# Patient Record
Sex: Male | Born: 1970 | Race: White | Hispanic: No | Smoking: Never smoker
Health system: Southern US, Community
[De-identification: ages and names within clinical notes are randomized; demographics above are authoritative.]

## PROBLEM LIST (undated history)

## (undated) DIAGNOSIS — I4892 Unspecified atrial flutter: Secondary | ICD-10-CM

## (undated) DIAGNOSIS — M1990 Unspecified osteoarthritis, unspecified site: Secondary | ICD-10-CM

## (undated) DIAGNOSIS — E1140 Type 2 diabetes mellitus with diabetic neuropathy, unspecified: Secondary | ICD-10-CM

## (undated) DIAGNOSIS — M109 Gout, unspecified: Secondary | ICD-10-CM

## (undated) DIAGNOSIS — I10 Essential (primary) hypertension: Secondary | ICD-10-CM

## (undated) DIAGNOSIS — I4891 Unspecified atrial fibrillation: Secondary | ICD-10-CM

## (undated) DIAGNOSIS — I200 Unstable angina: Secondary | ICD-10-CM

## (undated) DIAGNOSIS — E119 Type 2 diabetes mellitus without complications: Secondary | ICD-10-CM

## (undated) HISTORY — PX: APPENDECTOMY: SHX54

## (undated) HISTORY — PX: SHOULDER SURGERY: SHX246

## (undated) HISTORY — PX: TONSILLECTOMY: SUR1361

---

## 2014-07-28 ENCOUNTER — Observation Stay: Payer: Self-pay | Admitting: Internal Medicine

## 2014-07-29 DIAGNOSIS — R079 Chest pain, unspecified: Secondary | ICD-10-CM

## 2014-09-10 ENCOUNTER — Emergency Department
Admit: 2014-09-10 | Disposition: A | Discharge status: Other Acute Inpatient Hospital | Payer: Self-pay | Admitting: Emergency Medicine

## 2014-09-10 LAB — CBC
HCT: 46.6 % (ref 40.0–52.0)
HGB: 15.2 g/dL (ref 13.0–18.0)
MCH: 27.7 pg (ref 26.0–34.0)
MCHC: 32.6 g/dL (ref 32.0–36.0)
MCV: 85 fL (ref 80–100)
Platelet: 181 10*3/uL (ref 150–440)
RBC: 5.5 10*6/uL (ref 4.40–5.90)
RDW: 14.3 % (ref 11.5–14.5)
WBC: 9.3 10*3/uL (ref 3.8–10.6)

## 2014-09-10 LAB — BASIC METABOLIC PANEL
Anion Gap: 7 (ref 7–16)
BUN: 13 mg/dL
CALCIUM: 9 mg/dL
CO2: 30 mmol/L
Chloride: 102 mmol/L
Creatinine: 1.08 mg/dL
EGFR (African American): >60
Glucose: 157 mg/dL — ABNORMAL HIGH
Potassium: 3.6 mmol/L
SODIUM: 139 mmol/L

## 2014-09-10 LAB — PRO B NATRIURETIC PEPTIDE: B-Type Natriuretic Peptide: 35 pg/mL

## 2014-09-10 LAB — TROPONIN I

## 2014-10-09 NOTE — Consult Note (Signed)
Patient is over weight limit for cardiac cath. May go home with f/u office next tuesday 11 am.  Electronic Signatures: Radene KneeKhan, Tannon Peerson Ali (MD)  (Signed on 19-Feb-16 11:24)  Authored  Last Updated: 19-Feb-16 11:24 by Radene KneeKhan, Tyrian Peart Ali (MD)

## 2014-10-09 NOTE — H&P (Signed)
PATIENT NAME:  Francisco Gardner, Francisco Gardner MR#:  [id number] DATE OF BIRTH:  1971/02/08  DATE OF ADMISSION:  07/28/2014  REFERRING EMERGENCY ROOM PHYSICIAN: Dr. Alfonse FlavorsPhilip Stafford.   PRIMARY CARE PHYSICIAN: Nonlocal.   CHIEF COMPLAINT: Chest pain.   HISTORY OF PRESENT ILLNESS: This 44 year old man with past medical history of diabetes mellitus type 2 with peripheral neuropathy, hypertension, obesity, reflux, presents with chest pain starting 2 hours prior to presentation to the Emergency Room. The patient reports that he awoke this morning and felt funny, slightly lightheaded, took his blood pressure and it was 150/90. He continued to feel strange and blood pressure increased up to 182/122 with a pulse of 106. He then developed a sharp left-sided chest pain with no radiation, 7-8 out of 10. He also had some diaphoresis and cold chills, no nausea or vomiting. Pain has now progressed to being left chest and left upper quadrant as well as some left-sided back pain. He has not had any nausea, vomiting, or diarrhea. He does have a history of hiatal hernia and nephrolithiasis. On presentation to the Emergency Room his EKG is normal with no ST changes. His first set of enzymes is negative. He is being admitted for cardiac rule out. The patient reports that he had a stress Myoview 6-7 years ago and was told that it was negative for ischemic change.   PAST MEDICAL HISTORY:  1. Hypertension.  2. Depression.  3. Diabetes mellitus type 2.  4. Peripheral neuropathy due to diabetes.  5. Osteoarthritis.  6. Gout.  7. Fibromyalgia.  8. Nephrolithiasis.  9. History of superficial thrombophlebitis.  10. Gastroesophageal reflux disease.  11. Obesity with BMI of 48.2.   SOCIAL HISTORY: The patient currently lives with his sister. He moved here 4 days ago from [place]. He is now separated from his wife. He has never been a cigarette smoker. He drinks alcohol rarely, last 2-3 drinks [place] [employer]. No illicit  substance abuse. He is currently applying for disability.   FAMILY MEDICAL HISTORY: Positive for diabetes in his mother. Coronary artery disease in his father. No history of stroke. His mother also had rheumatic fever with resultant congestive heart failure.   ALLERGIES: HE IS ALLERGIC TO PENICILLIN WHICH CAUSES ANAPHYLAXIS.   HOME MEDICATIONS:  1. Sertraline 50 mg 1 tablet daily.  2. Lisinopril 40 mg 1 tablet daily.  3. Janumet 1000 mg-50 mg 1 tablet twice a day.  4. Indomethacin 25 mg 1 capsule once a day at bedtime.  5. Gabapentin 100 mg 2 tablets 3 times a day.  6. Dexilant 60 mg 1 capsule once a day in the morning.  7. Carvedilol 12.5 mg 1 tablet twice a day.  8. Caduet 5 mg-10 mg 1 tablet once a day.  9. Amitriptyline 50 mg 1-2 tablets once a day at bedtime.   REVIEW OF SYSTEMS:  CONSTITUTIONAL: No fever fatigue, weakness, or weight change.  HEENT: No change in vision or hearing. No pain in the eyes or ears. He has a very dry mouth, but no sore throat. No difficulty swallowing.  RESPIRATORY: No cough, wheezing, or hemoptysis. No dyspnea. It is slightly painful to take a deep breath. No history of COPD.   CARDIOVASCULAR: Positive for chest pain, no orthopnea, positive for edema after the 5 hour drive from [place]. No palpitations. No syncope.  GASTROINTESTINAL: No nausea, vomiting, diarrhea. Does have left upper quadrant pain at this time.  GENITOURINARY: No dysuria or frequency.  ENDOCRINE: No polyuria, polydipsia, or  hot or cold intolerance.  SKIN: No new rashes or easy bruising.  MUSCULOSKELETAL: No new pain in the neck, shoulders, knees, or hips. Does have a mid thoracic back pain. Does have history of gout.  NEUROLOGIC: No focal numbness or weakness. No confusion, CVA, or seizure. No memory loss.  PSYCHIATRIC: Does have depression and has been under a significant amount of stress recently due to being separated from his wife.   PHYSICAL EXAMINATION:  VITAL SIGNS:  Temperature 98.9, pulse 93, respirations 21, blood pressure 158/106, oxygenation 95% on room air.  GENERAL: No acute distress, obese.  HEENT: Pupils are fairly dilated, they are equal, round, and reactive to light, conjunctivae are clear, oral mucous membranes are dry with caked mucus in the mouth, no posterior oropharyngeal erythema or edema. Good dentition. Trachea is midline. Thyroid nontender.  RESPIRATORY: Lungs clear to auscultation bilaterally with good air movement.  CARDIOVASCULAR: Distant heart sounds. No murmurs, rubs, or gallops. Rhythm is regular. No peripheral edema. Peripheral pulses are 2 +.  ABDOMEN: Soft, nontender. He does have a midline hernia. No guarding, no rebound. Bowel sounds are normal.  SKIN: No rashes or open lesions, no new wounds.  LYMPHADENOPATHY: No cervical lymphadenopathy.  NEUROLOGIC: Cranial nerves II through XII grossly intact. Strength and sensation intact, 4 out of 4 bilaterally upper and lower extremities.  PSYCHIATRIC: The patient alert and oriented with good insight into his clinical condition.   LABORATORY DATA: Sodium 140, potassium 4.0, chloride 106, bicarbonate 31, BUN 14, creatinine 1.33. GFR is greater than 60. Calcium 8.8. Troponin less than 0.02. White blood cells 7.5, hemoglobin 15.6, platelets 213,000, MCV is 86. INR is 1.0.   IMAGING: Chest x-ray, no edema or consolidation.   ASSESSMENT AND PLAN:  1.  Left-sided chest pain: The patient being admitted for ACS rule out. We will cycle cardiac enzymes, monitor on telemetry, and recheck EKG in the morning. He is on aspirin, beta blocker, and a statin. We will hold off on heparin drip unless troponins escalate or EKG changes arise. Check ECHO. There is also a possibility of PE in this obese patient with history of superficial thrombophlebitis who has just had a 5 hour car trip, which ended with a lower extremity edema. He is too large for our CT scanner. We will check bilateral lower extremity Dopplers  and a VQ scan. He will be on heparin for DVT prophylaxis. We will hold off on starting therapeutic heparin at this time. He will have nitroglycerin p.r.n. for chest pain.  2.  Diabetes mellitus, type II: Check a hemoglobin A1c. Hold oral hypoglycemic agents and start sliding scale insulin.  3.  Peripheral neuropathy: Continue with Neurontin.  4.  Hypertension: Continue with Coreg and Caduet.  We will decrease his lisinopril to 20 mg as this should not impact blood pressure, but may improve his creatinine.  5.  Obesity: Calorie restricted diet.  6.  Gastroesophageal reflux disease: PPI.   7.  Depression: Continue home medications.  8.  Prophylaxis: As mentioned above heparin for DVT prophylaxis, PPI for gastroesophageal reflux disease.   TIME SPENT ON ADMISSION: 40 minutes.     ____________________________ Ena Dawley. Clent Ridges, MD cpw:bu D: 07/28/2014 14:35:12 ET T: 07/28/2014 14:57:28 ET JOB#: [id number]  cc: Santina Evans P. Clent Ridges, MD, <Dictator> Gale Journey MD ELECTRONICALLY SIGNED 07/28/2014 23:24

## 2014-10-09 NOTE — Consult Note (Signed)
PATIENT NAME:  Francisco Gardner, Francisco Gardner MR#:  [id number] DATE OF BIRTH:  07-08-1970  DATE OF CONSULTATION:  07/29/2014  REFERRING PHYSICIAN:   CONSULTING PHYSICIAN:  Laurier NancyShaukat A. Takeo Harts, MD  INDICATION FOR CONSULTATION: Chest pain.   HISTORY OF PRESENT ILLNESS: This is a 44 year old white male with a past medical history of diabetes mellitus, hypertension, hyperlipidemia, morbid obesity, peripheral vascular disease with peripheral neuropathy, who presented to the Emergency Room with chest pain. Chest pain is in left precordium associated with shortness of breath and diaphoresis. He had a stress Myoview in the past, 6 or 7 years ago, and was told it was negative.   PAST MEDICAL HISTORY: Hypertension, depression, diabetes mellitus type 2, peripheral neuropathy, osteoarthritis, gout, fibromyalgia, and nephrolithiasis, GI reflux, BMI over 48.2, history of thrombophlebitis.   SOCIAL HISTORY: He moved from [place]. He drinks about 2 to 3 beers a day. He is separated from his wife. He never smoked cigarettes.   FAMILY HISTORY: Positive for diabetes, coronary artery disease. Father had bypass.   ALLERGIES: PENICILLIN AND CAUSES ANAPHYLAXIS.   PHYSICAL EXAMINATION: GENERAL: He is alert and oriented x3, in mild distress due to shortness of breath and chest pain. VITAL SIGNS: Blood pressure is 160/95, respirations 20, pulse 71. Temperature is 98, saturation is 96%.  NECK: Revealed no JVD.  LUNGS: Clear.  HEART: Regular rate and rhythm. Normal S1, S2. No audible murmur.  ABDOMEN: Soft, nontender, positive bowel sounds.  EXTREMITIES: No pedal edema.  NEUROLOGIC: The patient appears to be intact.  DIAGNOSTIC DATA: EKG shows normal sinus rhythm. No acute changes. Cardiac enzymes are negative. Creatinine is 1.33.   ASSESSMENT AND PLAN: The patient has unstable angina. Multiple risk factors including diabetes, hypertension, hyperlipidemia, morbid obesity. Because of morbid obesity, a nuclear scan would not  be helpful. He had an echocardiogram, which showed segmental wall motion abnormalities suggestive of coronary artery disease with all the multiple risk factors of coronary artery disease. Advise proceeding with cardiac catheterization.    ____________________________ Laurier NancyShaukat A. Coleby Yett, MD sak:sw D: 07/29/2014 08:50:41 ET T: 07/29/2014 08:55:48 ET JOB#: [id number]  cc: Laurier NancyShaukat A. Nyasia Baxley, MD, <Dictator> Laurier NancySHAUKAT A Lillianna Sabel MD ELECTRONICALLY SIGNED 08/11/2014 12:17

## 2014-10-09 NOTE — Discharge Summary (Signed)
PATIENT NAME:  Francisco Gardner, Francisco Gardner MR#:  [id number] DATE OF BIRTH:  [birth year]  DATE OF ADMISSION:  07/28/2014 DATE OF DISCHARGE:  08/02/2014  PRESENTING COMPLAINT: Chest pain.   DISCHARGE DIAGNOSES:  1.  Chest pain.  2.  Hypertension.  3.  Type 2 diabetes.  4.  Morbid obesity.   PROCEDURES: Two-day Myoview stress test that showed normal sinus rhythm. LV global function was normal. Myocardial perfusion showed evidence of pathology that has abnormal appearance. There is no significant wall motion abnormality. Ejection fraction of 54%. No EKG changes concerning for ischemia. Mild ischemia in the LAD territory and borderline LV systolic function. Advised aspirin, Plavix, statins, and Imdur.   CONSULTATIONS: Cardiology consultation with Dr. Adrian BlackwaterShaukat Khan.   DISCHARGE MEDICATIONS: 1.  Amitriptyline 50 mg 1-2 once at bedtime.  2.  Carvedilol 12.5 mg 1 tablet b.i.d.  3.  Gabapentin 100 mg 2 capsules 3 times a day.  4.  Sertraline 50 mg 1 tablet daily.  5.  Dexilant 60 mg p.o. daily.  6.  Janumet 1000/50 one tablet b.i.d.  7.  Indomethacin 25 mg at bedtime.  8.  Caduet 5/10 one tablet daily.  9.  Lisinopril 40 mg daily.  10.  Aspirin 81 mg daily.  11.  Plavix 75 mg daily.    FOLLOWUP: With Dr. Adrian BlackwaterShaukat Khan in 1-2 weeks.  BRIEF HISTORY AND HOSPITAL COURSE: [name] is a very obese 425-pound gentleman with past medical history of hypertension and diabetes, comes in with chest pain. He was admitted with:  1.  Chest pain, likely unstable angina. Lower extremity Doppler negative for clot. VQ scan low probability. Troponin x 3 were negative. Since the patient's echocardiogram showed some wall motion abnormality, hence, could not do a cardiac catheterization due to his weight. The patient had a 2-day Myoview stress test results as above were noted. He will continue medical treatment with aspirin, Plavix, beta blockers, and Imdur. Follow up with Dr. Welton FlakesKhan as outpatient.  2.  Hypertension. Continued  Coreg and lisinopril.  3.  Type 2 diabetes. Resumed back his home medications.  4.  Neuropathy from diabetes. Continue Neurontin.  5.  Hyperlipidemia, on statins.   Hospital stay otherwise remained stable. The patient remained a full code.   TIME SPENT: 40 minutes.    ____________________________ Wylie HailSona A. Allena KatzPatel, MD sap:bm D: 08/04/2014 14:34:28 ET T: 08/05/2014 01:35:34 ET JOB#: [id number]  cc: Alaila Pillard A. Allena KatzPatel, MD, <Dictator> Laurier NancyShaukat A. Khan, MD Willow OraSONA A Lakeith Careaga MD ELECTRONICALLY SIGNED 08/09/2014 17:35

## 2014-10-09 NOTE — Consult Note (Signed)
Stress test is mildly abnormal, but unable to do cath due to over the wight limit, advise medical treatment initially and if it fails, [facility]  Electronic Signatures: Radene KneeKhan, Shaukat Ali (MD)  (Signed on 23-Feb-16 12:37)  Authored  Last Updated: 23-Feb-16 12:37 by Radene KneeKhan, Shaukat Ali (MD)

## 2014-10-18 ENCOUNTER — Ambulatory Visit: Payer: Self-pay

## 2015-07-03 ENCOUNTER — Emergency Department: Payer: Self-pay

## 2015-07-03 ENCOUNTER — Emergency Department
Admission: EM | Admit: 2015-07-03 | Discharge: 2015-07-03 | Disposition: A | Discharge status: Home / Self Care | Payer: Self-pay | Attending: Emergency Medicine | Admitting: Emergency Medicine

## 2015-07-03 DIAGNOSIS — R0789 Other chest pain: Secondary | ICD-10-CM | POA: Insufficient documentation

## 2015-07-03 DIAGNOSIS — Z880 Allergy status to penicillin: Secondary | ICD-10-CM | POA: Insufficient documentation

## 2015-07-03 LAB — CBC WITH DIFFERENTIAL/PLATELET
Basophils Absolute: 0 10*3/uL (ref 0–0.1)
Basophils Relative: 1 %
Eosinophils Absolute: 0.1 10*3/uL (ref 0–0.7)
Eosinophils Relative: 1 %
HEMATOCRIT: 43.4 % (ref 40.0–52.0)
HEMOGLOBIN: 14.5 g/dL (ref 13.0–18.0)
LYMPHS ABS: 0.9 10*3/uL — AB (ref 1.0–3.6)
LYMPHS PCT: 13 %
MCH: 27.5 pg (ref 26.0–34.0)
MCHC: 33.4 g/dL (ref 32.0–36.0)
MCV: 82.4 fL (ref 80.0–100.0)
MONOS PCT: 6 %
Monocytes Absolute: 0.4 10*3/uL (ref 0.2–1.0)
NEUTROS ABS: 5.6 10*3/uL (ref 1.4–6.5)
NEUTROS PCT: 79 %
PLATELETS: 141 10*3/uL — AB (ref 150–440)
RBC: 5.27 MIL/uL (ref 4.40–5.90)
RDW: 14.1 % (ref 11.5–14.5)
WBC: 7 10*3/uL (ref 3.8–10.6)

## 2015-07-03 LAB — TROPONIN I: Troponin I: <0.03 ng/mL (ref <0.031)

## 2015-07-03 LAB — BASIC METABOLIC PANEL
Anion gap: 8 (ref 5–15)
BUN: 12 mg/dL (ref 6–20)
CHLORIDE: 105 mmol/L (ref 101–111)
CO2: 24 mmol/L (ref 22–32)
CREATININE: 1.01 mg/dL (ref 0.61–1.24)
Calcium: 9 mg/dL (ref 8.9–10.3)
GFR calc Af Amer: >60 mL/min (ref >60)
GLUCOSE: 171 mg/dL — AB (ref 65–99)
POTASSIUM: 3.7 mmol/L (ref 3.5–5.1)
Sodium: 137 mmol/L (ref 135–145)

## 2015-07-03 MED ORDER — OXYCODONE-ACETAMINOPHEN 5-325 MG PO TABS
1.0000 | ORAL_TABLET | Freq: Once | ORAL | Status: AC
  Administered 2015-07-03: 1 via ORAL
  Filled 2015-07-03: qty 1

## 2015-07-03 NOTE — Discharge Instructions (Signed)
We are reassured by your findings today. If you have increased pain in her chest, shortness of breath every her baseline, nausea, vomiting, leg swelling, numbness, weakness, change in chronic chest pain, worsening chronic chest pain 40 feel worse in any way return to the emergency department. Follow-up without fail with cardiologist tomorrow.

## 2015-07-03 NOTE — ED Notes (Signed)
Pt reports worsening SOB, tired and light headed. Pt reports sob at baseline but reports it is worse today

## 2015-07-03 NOTE — ED Notes (Addendum)
Pt to triage via w/c with no distress noted; Pt c/o pain to left upper chest radiating into left arm accomp by dizziness and weakness, SOB

## 2015-07-03 NOTE — ED Provider Notes (Addendum)
[facility] Emergency Department Provider Note  ____________________________________________   I have reviewed the triage vital signs and the nursing notes.   HISTORY  Chief Complaint Chest Pain    HPI Francisco Gardner is a 45 y.o. male who has a history of morbid obesity, chronic recurrent chest pain which she states he gets twice a month for at least, dyspnea on exertion presents today with the above complaints. He states he is having all his usual symptoms but they've "might be" a little worse today. He is also concerned because he does not have his glucose strips and has not been able to check his sugar. He denies any fever or chills nausea or vomiting. The patient states this is exact same pain he gets every week or 2. He did take aspirin at home. He states he is compliant with his medications. He states his pain is approximately 2 out of 10. It happened at rest and has been there present for the entire day. He denies any exertional symptoms aside from his baseline shortness of breath from "being overweight". Denies any cough or runny nose pleuritic chest pain. Denies any leg swelling or history of PE or DVT. He did have a negative VQ scan last time he was admitted for the same symptoms. Patient had a negative cardiac catheter on a bariatric bed in April of this year CARDIAC CATHETERIZATION:  Diagnostic Findings Coronary arteries Dominance: right Left main: normal LAD: insignificant LCx: normal RCA: normal  ECHOCARDIOGRAM:  NORMAL LEFT VENTRICULAR FUNCTION WITH MODERATE LVH NORMAL RIGHT VENTRICULAR SYSTOLIC FUNCTION NO VALVULAR REGURGITATION NO VALVULAR STENOSIS POOR SOUND TRANSMISSION NO PRIOR STUDY FOR COMPARISON   No past medical history on file.  There are no active problems to display for this patient.   No past surgical history on file.  No current outpatient prescriptions on file.  Allergies Penicillins  No family history on  file.  Social History Social History  Substance Use Topics  . Smoking status: Not on file  . Smokeless tobacco: Not on file  . Alcohol Use: Not on file    Review of Systems Constitutional: No fever/chills Eyes: No visual changes. ENT: No sore throat. No stiff neck no neck pain Cardiovascular: She history of present illness Respiratory: See history of present illness Gastrointestinal:   no vomiting.  No diarrhea.  No constipation. Genitourinary: Negative for dysuria. Musculoskeletal: Negative lower extremity swelling Skin: Negative for rash. Neurological: Negative for headaches, focal weakness or numbness. 10-point ROS otherwise negative.  ____________________________________________   PHYSICAL EXAM:  VITAL SIGNS: ED Triage Vitals  Enc Vitals Group     BP 07/03/15 2136 143/90 mmHg     Pulse Rate 07/03/15 2136 79     Resp 07/03/15 2136 20     Temp 07/03/15 2136 98.2 F (36.8 C)     Temp Source 07/03/15 2136 Oral     SpO2 07/03/15 2136 95 %     Weight 07/03/15 2136 457 lb (207.294 kg)     Height 07/03/15 2136  (1.956 m)     Head Cir --      Peak Flow --      Pain Score 07/03/15 2141 4     Pain Loc --      Pain Edu? --      Excl. in GC? --     Constitutional: Alert and oriented. Well appearing and in no acute distress. Speaks in full sentences with no evidence of dyspnea Eyes: Conjunctivae are normal. PERRL.  EOMI. Head: Atraumatic. Nose: No congestion/rhinnorhea. Mouth/Throat: Mucous membranes are moist.  Oropharynx non-erythematous. Neck: No stridor.   Nontender with no meningismus Cardiovascular: Normal rate, regular rhythm. Grossly normal heart sounds.  Good peripheral circulation. Chest: Tender to palpation left chest wall which reduces the patient pain there is no evidence of crepitus there is no evidence of flail chest or rib fracture, when I touch this area patient states "ouch that's the pain right there" Respiratory: Normal respiratory effort.  No  retractions. Lungs CTAB. Abdominal: Soft and nontender. No distention. No guarding no rebound Back:  There is no focal tenderness or step off there is no midline tenderness there are no lesions noted. there is no CVA tenderness Musculoskeletal: No lower extremity tenderness. No joint effusions, no DVT signs strong distal pulses no edema Neurologic:  Normal speech and language. No gross focal neurologic deficits are appreciated.  Skin:  Skin is warm, dry and intact. No rash noted. Psychiatric: Mood and affect are normal. Speech and behavior are normal.  ____________________________________________   LABS (all labs ordered are listed, but only abnormal results are displayed)  Labs Reviewed  CBC WITH DIFFERENTIAL/PLATELET - Abnormal; Notable for the following:    Platelets 141 (*)    Lymphs Abs 0.9 (*)    All other components within normal limits  BASIC METABOLIC PANEL - Abnormal; Notable for the following:    Glucose, Bld 171 (*)    All other components within normal limits  TROPONIN I   ____________________________________________  EKG  I personally interpreted any EKGs ordered by me or triage Normal sinus rhythm, left axis deviation no acute ST elevation or acute ST depression nonspecific ST changes no acute ischemia ____________________________________________  RADIOLOGY  I reviewed any imaging ordered by me or triage that were performed during my shift ____________________________________________   PROCEDURES  Procedure(s) performed: None  Critical Care performed: None  ____________________________________________   INITIAL IMPRESSION / ASSESSMENT AND PLAN / ED COURSE  Pertinent labs & imaging results that were available during my care of the patient were reviewed by me and considered in my medical decision making (see chart for details).  Patient with chronic recurrent chest pain which is noncardiac in origin, who had a normal EF and normal heart catheter within  the last year presents today with his normal recurrent chest pain which is reproducible and has been there all day. EKG does not show any acute ischemic changes. Patient has somewhat reproducible discomfort. He states it radiates "everywhere". Given that he has had uninterrupted pain all day and a normal heart catheter in the last 12 month, I do not know if it would be of utility to check serial enzymes. We will give him something for discomfort. There is no evidence of PE, dissection, pericarditis endocarditis myocarditis pneumothorax pneumonia or other acute pathology nor is there evidence at this time of referred abdominal pain causing his symptoms.  ----------------------------------------- 11:19 PM on 07/03/2015 -----------------------------------------  At this time, there does not appear to be clinical evidence to support the diagnosis of pulmonary embolus, dissection, myocarditis, endocarditis, pericarditis, pericardial tamponade, acute coronary syndrome, pneumothorax, pneumonia, or any other acute intrathoracic pathology that will require admission or acute intervention. Nor is there evidence of any significant intra-abdominal pathology causing this discomfort. I did offer the patient serial cardiac enzymes and further observation and he refuses. He states he feels much better and wants to go home. Given the patient refuses further workup, had a normal cardiac catheterization this year, has normal vital signs,  and no evidence of ongoing ischemia we will discharge her with close follow-up with his cardiologist tomorrow. Return precautions and follow-up given and understood. ____________________________________________   FINAL CLINICAL IMPRESSION(S) / ED DIAGNOSES  Final diagnoses:  None     Jeanmarie Plant, MD 07/03/15 2239  Jeanmarie Plant, MD 07/03/15 [phone]

## 2016-03-01 ENCOUNTER — Emergency Department
Admission: EM | Admit: 2016-03-01 | Discharge: 2016-03-01 | Disposition: A | Discharge status: Home / Self Care | Payer: Self-pay | Attending: Emergency Medicine | Admitting: Emergency Medicine

## 2016-03-01 ENCOUNTER — Emergency Department: Payer: Self-pay

## 2016-03-01 ENCOUNTER — Encounter: Payer: Self-pay | Admitting: Urgent Care

## 2016-03-01 DIAGNOSIS — I10 Essential (primary) hypertension: Secondary | ICD-10-CM | POA: Insufficient documentation

## 2016-03-01 DIAGNOSIS — E1165 Type 2 diabetes mellitus with hyperglycemia: Secondary | ICD-10-CM | POA: Insufficient documentation

## 2016-03-01 DIAGNOSIS — R739 Hyperglycemia, unspecified: Secondary | ICD-10-CM

## 2016-03-01 DIAGNOSIS — R0789 Other chest pain: Secondary | ICD-10-CM | POA: Insufficient documentation

## 2016-03-01 HISTORY — DX: Unstable angina: I20.0

## 2016-03-01 HISTORY — DX: Type 2 diabetes mellitus with diabetic neuropathy, unspecified: E11.40

## 2016-03-01 HISTORY — DX: Unspecified atrial flutter: I48.92

## 2016-03-01 HISTORY — DX: Unspecified atrial fibrillation: I48.91

## 2016-03-01 HISTORY — DX: Type 2 diabetes mellitus without complications: E11.9

## 2016-03-01 HISTORY — DX: Gout, unspecified: M10.9

## 2016-03-01 HISTORY — DX: Unspecified osteoarthritis, unspecified site: M19.90

## 2016-03-01 HISTORY — DX: Essential (primary) hypertension: I10

## 2016-03-01 LAB — CBC
HCT: 48.1 % (ref 40.0–52.0)
Hemoglobin: 16.5 g/dL (ref 13.0–18.0)
MCH: 28.8 pg (ref 26.0–34.0)
MCHC: 34.3 g/dL (ref 32.0–36.0)
MCV: 84 fL (ref 80.0–100.0)
PLATELETS: 184 10*3/uL (ref 150–440)
RBC: 5.72 MIL/uL (ref 4.40–5.90)
RDW: 13.8 % (ref 11.5–14.5)
WBC: 11.2 10*3/uL — ABNORMAL HIGH (ref 3.8–10.6)

## 2016-03-01 LAB — HEPATIC FUNCTION PANEL
ALBUMIN: 3.8 g/dL (ref 3.5–5.0)
ALT: 24 U/L (ref 17–63)
AST: 23 U/L (ref 15–41)
Alkaline Phosphatase: 121 U/L (ref 38–126)
Bilirubin, Direct: 0.1 mg/dL (ref 0.1–0.5)
Indirect Bilirubin: 0.8 mg/dL (ref 0.3–0.9)
TOTAL PROTEIN: 7.7 g/dL (ref 6.5–8.1)
Total Bilirubin: 0.9 mg/dL (ref 0.3–1.2)

## 2016-03-01 LAB — BASIC METABOLIC PANEL
ANION GAP: 9 (ref 5–15)
BUN: 11 mg/dL (ref 6–20)
CALCIUM: 9.1 mg/dL (ref 8.9–10.3)
CHLORIDE: 101 mmol/L (ref 101–111)
CO2: 26 mmol/L (ref 22–32)
Creatinine, Ser: 1.12 mg/dL (ref 0.61–1.24)
GFR calc Af Amer: >60 mL/min (ref >60)
GFR calc non Af Amer: >60 mL/min (ref >60)
Glucose, Bld: 287 mg/dL — ABNORMAL HIGH (ref 65–99)
Potassium: 3.9 mmol/L (ref 3.5–5.1)
Sodium: 136 mmol/L (ref 135–145)

## 2016-03-01 LAB — GLUCOSE, CAPILLARY: Glucose-Capillary: 261 mg/dL — ABNORMAL HIGH (ref 65–99)

## 2016-03-01 LAB — TROPONIN I

## 2016-03-01 MED ORDER — ASPIRIN 81 MG PO CHEW
324.0000 mg | CHEWABLE_TABLET | Freq: Once | ORAL | Status: AC
  Administered 2016-03-01: 324 mg via ORAL
  Filled 2016-03-01: qty 4

## 2016-03-01 MED ORDER — SODIUM CHLORIDE 0.9 % IV BOLUS (SEPSIS)
1000.0000 mL | INTRAVENOUS | Status: AC
  Administered 2016-03-01: 1000 mL via INTRAVENOUS

## 2016-03-01 NOTE — ED Notes (Signed)
Report to matt martin, rn.  

## 2016-03-01 NOTE — Discharge Instructions (Signed)
As we discussed, your workup today was reassuring.  Though we do not know exactly what is causing your symptoms, it appears that you have no emergent medical condition at this time are safe to go home and follow up as recommended in this paperwork.  Continue taking your usual medications.  Please return immediately to the Emergency Department if you develop any new or worsening symptoms that concern you.

## 2016-03-01 NOTE — ED Provider Notes (Signed)
[facility] Emergency Department Provider Note  ____________________________________________   First MD Initiated Contact with Patient 03/01/16 2129     (approximate)  I have reviewed the triage vital signs and the nursing notes.   HISTORY  Chief Complaint Headache; Hyperglycemia; Hypertension; and Chest Pain    HPI Francisco Gardner is a 45 y.o. male with morbid obesity and multiple chronic medical issues as described below.  He presents for evaluation of hypertension, hyperglycemia, a persistent headache, and occasional chest pain that apparently has been going on for an extended period of time.  He does state that he has a diagnosis of angina.  He states that his blood sugar has not been well controlled on his oral medication recently.  He used to take insulin but they tried to keep him off of that.  He has been to both [facility] [place] [facility] and is trying to get an appointment.  He describes his symptoms as moderate and nothing is making them better or worse.  He occasionally has shortness of breath.  He denies fever/chills.  Symptoms started days ago, gradual in onset and intermittent.   Past Medical History:  Diagnosis Date  . Arthritis   . Atrial fibrillation and flutter (HCC)   . Diabetes mellitus without complication (HCC)   . Diabetic neuropathy (HCC)   . Gout   . Hypertension   . Unstable angina (HCC)     There are no active problems to display for this patient.   No past surgical history on file.  Prior to Admission medications   Not on File    Allergies Penicillins  No family history on file.  Social History Social History  Substance Use Topics  . Smoking status: Never Smoker  . Smokeless tobacco: Never Used  . Alcohol use No    Review of Systems Constitutional: No fever/chills Eyes: No visual changes. ENT: No sore throat. Cardiovascular: +chest pain. Respiratory: +shortness of  breath. Gastrointestinal: No abdominal pain.  No nausea, no vomiting.  No diarrhea.  No constipation. Genitourinary: Negative for dysuria. Musculoskeletal: Negative for back pain. Skin: Negative for rash. Neurological: +HA.  +Dizziness.  10-point ROS otherwise negative.  ____________________________________________   PHYSICAL EXAM:  VITAL SIGNS: ED Triage Vitals [03/01/16 2037]  Enc Vitals Group     BP (!) 174/118     Pulse Rate (!) 115     Resp (!) 22     Temp 98.3 F (36.8 C)     Temp Source Oral     SpO2 97 %     Weight (!) 432 lb (196 kg)     Height 6\' 9"  (2.057 m)     Head Circumference      Peak Flow      Pain Score 4     Pain Loc      Pain Edu?      Excl. in GC?     Constitutional: Alert and oriented. Well appearing and in no acute distress.  Morbidly obese. Eyes: Conjunctivae are normal. PERRL. EOMI. Head: Atraumatic. Nose: No congestion/rhinnorhea. Mouth/Throat: Mucous membranes are moist.  Oropharynx non-erythematous. Neck: No stridor.  No meningeal signs.   Cardiovascular: Normal rate, regular rhythm. Good peripheral circulation. Grossly normal heart sounds. Respiratory: Normal respiratory effort.  No retractions. Lungs CTAB. Gastrointestinal: Soft and nontender. No distention.  Easily reducible abdominal hernia. Musculoskeletal: No lower extremity tenderness nor edema. No gross deformities of extremities. Neurologic:  Normal speech and  language. No gross focal neurologic deficits are appreciated.  Skin:  Skin is warm, dry and intact. No rash noted. Psychiatric: Mood and affect are normal. Speech and behavior are normal.  ____________________________________________   LABS (all labs ordered are listed, but only abnormal results are displayed)  Labs Reviewed  BASIC METABOLIC PANEL - Abnormal; Notable for the following:       Result Value   Glucose, Bld 287 (*)    All other components within normal limits  CBC - Abnormal; Notable for the following:     WBC 11.2 (*)    All other components within normal limits  GLUCOSE, CAPILLARY - Abnormal; Notable for the following:    Glucose-Capillary 261 (*)    All other components within normal limits  TROPONIN I  HEPATIC FUNCTION PANEL   ____________________________________________  EKG  ED ECG REPORT I, Kaya Pottenger, the attending physician, personally viewed and interpreted this ECG.  Date: 03/01/2016 EKG Time: 20:41 Rate: 108 Rhythm: Sinus tachycardia QRS Axis: normal Intervals: normal ST/T Wave abnormalities: normal Conduction Disturbances: none Narrative Interpretation: unremarkable  ____________________________________________  RADIOLOGY   Dg Chest 2 View  Result Date: 03/01/2016 CLINICAL DATA:  Acute onset of central chest pain. Intermittent shortness of breath and lightheadedness. Hyperglycemia. Initial encounter. EXAM: CHEST  2 VIEW COMPARISON:  Chest radiograph from 07/03/2015 FINDINGS: Vascular congestion is noted. Chronically increased interstitial markings are again noted, similar in appearance to the prior study. This may reflect mild underlying chronic interstitial lung disease. Would correlate with the patient's symptoms and evaluate further as deemed clinically appropriate. The cardiomediastinal silhouette is normal in size. No acute osseous abnormalities are identified. IMPRESSION: Vascular congestion noted. Chronically increased interstitial markings are similar in appearance to the prior study. This may reflect mild underlying chronic interstitial lung disease. Would correlate with the patient's symptoms and evaluate further as deemed clinically appropriate. Electronically Signed   By: Roanna Raider M.D.   On: 03/01/2016 22:33    ____________________________________________   PROCEDURES  Procedure(s) performed:   Procedures   Critical Care performed: No ____________________________________________   INITIAL IMPRESSION / ASSESSMENT AND PLAN / ED  COURSE  Pertinent labs & imaging results that were available during my care of the patient were reviewed by me and considered in my medical decision making (see chart for details).  Patient's workup has been reassuring.  Vascular congestion on CXR, but lung exam normal, and patient has no difficulty breathing.  VSS.  Chest pain has been present for days, no indication for repeat troponin.  Glucose elevated but <300.  Gave 1L NS.  EKG reassuring as well.  Patient has established care with Dr. Milta Deiters; I advised him to follow up at the next available opportunity.  Gave usual/customary return precautions.  Gave full dose ASA.  Patient feels better, tachycardia resolved.  He agrees with plan, knows to return if symptoms worsen. ____________________________________  FINAL CLINICAL IMPRESSION(S) / ED DIAGNOSES  Final diagnoses:  Hyperglycemia  Atypical chest pain     MEDICATIONS GIVEN DURING THIS VISIT:  Medications  sodium chloride 0.9 % bolus 1,000 mL (0 mLs Intravenous Stopped 03/01/16 2323)  aspirin chewable tablet 324 mg (324 mg Oral Given 03/01/16 2324)     NEW OUTPATIENT MEDICATIONS STARTED DURING THIS VISIT:  There are no discharge medications for this patient.   There are no discharge medications for this patient.   There are no discharge medications for this patient.    Note:  This document was prepared using Sales executive  software and may include unintentional dictation errors.    Loleta Roseory Oluwaseun Cremer, MD 03/02/16 0110

## 2016-03-01 NOTE — ED Notes (Signed)
Pt states chest pain central chest that began approx 2000 today. Pt states his sugars have been high, he has a headache, central chest pain, intermittent shob, lightheadedness. Pt states chest pain is independent of inspiration or movement.

## 2016-03-01 NOTE — ED Triage Notes (Signed)
Patient presents to the ED tonight with multiple complaints. Patient reports that he has had a headache "all day". (+) HTN; BPs running in the 170/120s. (+) DM; CBGs elevated over 300 at home. Patient reports retrosternal CP and dizziness at this time.

## 2016-03-01 NOTE — ED Notes (Signed)
Pt provided with call bell and pillow. Pt appears in no acute distress.

## 2016-03-01 NOTE — ED Notes (Signed)
Pt. Going home with family. 

## 2016-05-06 ENCOUNTER — Encounter: Payer: Self-pay | Admitting: Pharmacist

## 2016-06-06 ENCOUNTER — Telehealth: Payer: Self-pay | Admitting: Pharmacist

## 2016-06-06 NOTE — Telephone Encounter (Signed)
Faxed PAP application to [employer] for [employer] Flexpens & tips- Levemir 20 units every night.

## 2016-10-16 ENCOUNTER — Telehealth: Payer: Self-pay | Admitting: Pharmacist

## 2016-10-16 NOTE — Telephone Encounter (Signed)
10/16/16 Called [facility] and placed refill for Janumet 50/1000.

## 2016-11-21 ENCOUNTER — Telehealth: Payer: Self-pay | Admitting: Pharmacy Technician

## 2016-11-21 NOTE — Telephone Encounter (Signed)
Patient eligible to receive medication assistance at [facility] until 07/11/17 as long as eligibility requirements continue to be met.  Betty J. Kluttz Care Manager [facility] 

## 2017-02-09 ENCOUNTER — Encounter: Payer: Self-pay | Admitting: Emergency Medicine

## 2017-02-09 ENCOUNTER — Emergency Department
Admission: EM | Admit: 2017-02-09 | Discharge: 2017-02-09 | Disposition: A | Discharge status: Home / Self Care | Payer: Self-pay | Attending: Emergency Medicine | Admitting: Emergency Medicine

## 2017-02-09 ENCOUNTER — Emergency Department: Payer: Self-pay

## 2017-02-09 DIAGNOSIS — E119 Type 2 diabetes mellitus without complications: Secondary | ICD-10-CM | POA: Insufficient documentation

## 2017-02-09 DIAGNOSIS — R079 Chest pain, unspecified: Secondary | ICD-10-CM | POA: Insufficient documentation

## 2017-02-09 DIAGNOSIS — I10 Essential (primary) hypertension: Secondary | ICD-10-CM | POA: Insufficient documentation

## 2017-02-09 LAB — CBC
HEMATOCRIT: 48.4 % (ref 40.0–52.0)
Hemoglobin: 16.4 g/dL (ref 13.0–18.0)
MCH: 28.4 pg (ref 26.0–34.0)
MCHC: 33.8 g/dL (ref 32.0–36.0)
MCV: 84 fL (ref 80.0–100.0)
Platelets: 202 10*3/uL (ref 150–440)
RBC: 5.76 MIL/uL (ref 4.40–5.90)
RDW: 13.8 % (ref 11.5–14.5)
WBC: 9 10*3/uL (ref 3.8–10.6)

## 2017-02-09 LAB — BASIC METABOLIC PANEL
Anion gap: 7 (ref 5–15)
BUN: 14 mg/dL (ref 6–20)
CHLORIDE: 102 mmol/L (ref 101–111)
CO2: 27 mmol/L (ref 22–32)
Calcium: 9.1 mg/dL (ref 8.9–10.3)
Creatinine, Ser: 1.07 mg/dL (ref 0.61–1.24)
GFR calc Af Amer: >60 mL/min (ref >60)
GFR calc non Af Amer: >60 mL/min (ref >60)
Glucose, Bld: 246 mg/dL — ABNORMAL HIGH (ref 65–99)
POTASSIUM: 3.9 mmol/L (ref 3.5–5.1)
SODIUM: 136 mmol/L (ref 135–145)

## 2017-02-09 LAB — TROPONIN I: Troponin I: <0.03 ng/mL (ref <0.03)

## 2017-02-09 MED ORDER — ISOSORBIDE MONONITRATE ER 30 MG PO TB24: 30.0000 mg | ORAL_TABLET | Freq: Every day | ORAL | 0 refills | Status: AC

## 2017-02-09 MED ORDER — SODIUM CHLORIDE 0.9 % IV SOLN
1000.0000 mL | Freq: Once | INTRAVENOUS | Status: AC
  Administered 2017-02-09: 1000 mL via INTRAVENOUS

## 2017-02-09 NOTE — ED Notes (Signed)
Patient transported to X-ray 

## 2017-02-09 NOTE — ED Notes (Addendum)

## 2017-02-09 NOTE — ED Provider Notes (Signed)
[facility] Emergency Department Provider Note   ____________________________________________    I have reviewed the triage vital signs and the nursing notes.   HISTORY  Chief Complaint Chest Pain     HPI Francisco Gardner is a 46 y.o. male Presents with fatigue and some dizziness over the last several days. He has also had chest pain. Patient reports he has a history of unstable angina, he notes that he did have significant chest pain nearly one week ago but reports since then his chest has been "okay". He reports 1-2 out of 10 pain intermittently but states that he feels good currently. No shortness of breath. Does not smoke. He became concerned when he felt fatigued over the last 24 hours and had some dizziness when standing. Chest pain has not worsened. No calf pain or swelling. Dr. Welton FlakesKhan is his cardiologist   Past Medical History:  Diagnosis Date  . Arthritis   . Atrial fibrillation and flutter (HCC)   . Diabetes mellitus without complication (HCC)   . Diabetic neuropathy (HCC)   . Gout   . Hypertension   . Unstable angina (HCC)     There are no active problems to display for this patient.   History reviewed. No pertinent surgical history.  Prior to Admission medications   Medication Sig Start Date End Date Taking? Authorizing Provider  isosorbide mononitrate (IMDUR) 30 MG 24 hr tablet Take 1 tablet (30 mg total) by mouth daily. 02/09/17 02/09/18  Jene EveryKinner, Tora Prunty, MD     Allergies Penicillins  No family history on file.  Social History Social History  Substance Use Topics  . Smoking status: Never Smoker  . Smokeless tobacco: Never Used  . Alcohol use No    Review of Systems  Constitutional: No fever/chills Eyes: No visual changes.  ENT: No sore throat. Cardiovascular: as above Respiratory: Denies shortness of breath. Gastrointestinal: No abdominal pain.  No nausea, no vomiting.   Genitourinary: Negative for  dysuria. Musculoskeletal: Negative for back pain. Skin: Negative for rash. Neurological: Negative for headaches   ____________________________________________   PHYSICAL EXAM:  VITAL SIGNS: ED Triage Vitals  Enc Vitals Group     BP 02/09/17 1406 (!) 165/106     Pulse Rate 02/09/17 1406 77     Resp 02/09/17 1406 16     Temp 02/09/17 1406 98.3 F (36.8 C)     Temp Source 02/09/17 1406 Oral     SpO2 02/09/17 1406 99 %     Weight 02/09/17 1406 (!) 196.4 kg (433 lb)     Height 02/09/17 1406 2.057 m (6\' 9" )     Head Circumference --      Peak Flow --      Pain Score 02/09/17 1405 3     Pain Loc --      Pain Edu? --      Excl. in GC? --     Constitutional: Alert and oriented. No acute distress. Pleasant and interactive Eyes: Conjunctivae are normal.   Nose: No congestion/rhinnorhea. Mouth/Throat: Mucous membranes are moist.    Cardiovascular: Normal rate, regular rhythm. Grossly normal heart sounds.  Good peripheral circulation. Respiratory: Normal respiratory effort.  No retractions. Lungs CTAB. Gastrointestinal: Soft and nontender. No distention.  No CVA tenderness. Genitourinary: deferred Musculoskeletal: No lower extremity tenderness nor edema.  Warm and well perfused Neurologic:  Normal speech and language. No gross focal neurologic deficits are appreciated.  Skin:  Skin is warm, dry and intact. No rash noted.  Psychiatric: Mood and affect are normal. Speech and behavior are normal.  ____________________________________________   LABS (all labs ordered are listed, but only abnormal results are displayed)  Labs Reviewed  BASIC METABOLIC PANEL - Abnormal; Notable for the following:       Result Value   Glucose, Bld 246 (*)    All other components within normal limits  CBC  TROPONIN I   ____________________________________________  EKG  ED ECG REPORT I, Jene Every, the attending physician, personally viewed and interpreted this ECG.  Date:  02/09/2017 EKG Time: 2:04 PM Rate: 82 Rhythm: normal sinus rhythm QRS Axis: normal Intervals: normal ST/T Wave abnormalities: normal Narrative Interpretation: no evidence of acute ischemia  ____________________________________________  RADIOLOGY  Chest x-ray Unremarkable ____________________________________________   PROCEDURES  Procedure(s) performed: No    Critical Care performed:No ____________________________________________   INITIAL IMPRESSION / ASSESSMENT AND PLAN / ED COURSE  Pertinent labs & imaging results that were available during my care of the patient were reviewed by me and considered in my medical decision making (see chart for details).  Patient well-appearing and in no acute distress. EKG is reassuring. Labs pending. Exam is unremarkable.patient reports he had a catheterization 2 years ago which was described him as normal. He reports compliance with his medications but has had difficulty controlling his blood pressure. He is working on this with his PCP  It seems that his primary concern is fatigue and some dizziness over the last 24 hours. We will check enzymes give IV fluids and reevaluate. Does not appear consistent with ACS nor dissection nor PE.  Unable to reach Dr. Welton Flakes. Patient notes he was taken off of Imdur and that helped him with angina pains. He would like to restart it and I think this is reasonable. He will follow up closely with Dr. Welton Flakes. He will return if his symptoms change/worsen.    ____________________________________________   FINAL CLINICAL IMPRESSION(S) / ED DIAGNOSES  Final diagnoses:  Nonspecific chest pain      NEW MEDICATIONS STARTED DURING THIS VISIT:  New Prescriptions   ISOSORBIDE MONONITRATE (IMDUR) 30 MG 24 HR TABLET    Take 1 tablet (30 mg total) by mouth daily.     Note:  This document was prepared using Dragon voice recognition software and may include unintentional dictation errors.    Jene Every,  MD 02/09/17 [phone]

## 2017-02-09 NOTE — ED Triage Notes (Signed)
Arrives with C/O chest pain and dizziness since Tuesday.  Patient states he has history of unstable angina, but has not been prescribed NTG.

## 2017-02-13 ENCOUNTER — Telehealth: Payer: Self-pay | Admitting: Pharmacist

## 2017-02-13 NOTE — Telephone Encounter (Signed)
02/13/17 Called [facility] for refill on Janumet 50/1000.Forde RadonAJ

## 2017-02-22 ENCOUNTER — Emergency Department
Admission: EM | Admit: 2017-02-22 | Discharge: 2017-02-22 | Disposition: A | Discharge status: Home / Self Care | Payer: Self-pay | Attending: Emergency Medicine | Admitting: Emergency Medicine

## 2017-02-22 ENCOUNTER — Encounter: Payer: Self-pay | Admitting: Emergency Medicine

## 2017-02-22 ENCOUNTER — Emergency Department: Payer: Self-pay

## 2017-02-22 DIAGNOSIS — E119 Type 2 diabetes mellitus without complications: Secondary | ICD-10-CM | POA: Insufficient documentation

## 2017-02-22 DIAGNOSIS — I10 Essential (primary) hypertension: Secondary | ICD-10-CM | POA: Insufficient documentation

## 2017-02-22 DIAGNOSIS — L02214 Cutaneous abscess of groin: Secondary | ICD-10-CM

## 2017-02-22 DIAGNOSIS — E1140 Type 2 diabetes mellitus with diabetic neuropathy, unspecified: Secondary | ICD-10-CM | POA: Insufficient documentation

## 2017-02-22 DIAGNOSIS — R1031 Right lower quadrant pain: Secondary | ICD-10-CM

## 2017-02-22 MED ORDER — OXYCODONE HCL 5 MG PO TABS: 5.0000 mg | ORAL_TABLET | Freq: Once | ORAL | Status: DC

## 2017-02-22 MED ORDER — LIDOCAINE HCL (PF) 1 % IJ SOLN
2.0000 mL | Freq: Once | INTRAMUSCULAR | Status: AC
  Administered 2017-02-22: 2 mL
  Filled 2017-02-22: qty 5

## 2017-02-22 MED ORDER — SULFAMETHOXAZOLE-TRIMETHOPRIM 800-160 MG PO TABS: 1.0000 | ORAL_TABLET | Freq: Two times a day (BID) | ORAL | 0 refills | Status: AC

## 2017-02-22 MED ORDER — ACETAMINOPHEN 500 MG PO TABS
1000.0000 mg | ORAL_TABLET | Freq: Once | ORAL | Status: AC
  Administered 2017-02-22: 1000 mg via ORAL
  Filled 2017-02-22: qty 2

## 2017-02-22 NOTE — ED Notes (Signed)
PA at bedside to I & D abscess to right groin

## 2017-02-22 NOTE — ED Triage Notes (Signed)
Pt to ED c/o pain in the right side of his groin. Pt states that he thinks that he may have a blood clot. Noticed the pain last night. Pt in NAD at this time.

## 2017-02-22 NOTE — Discharge Instructions (Signed)
Lease take antibiotics as prescribed. Follow-up with PCP, walk in clinic or the emergency department in 2-3 days for packing removal and wound check. Return to ER sooner for any fevers increasinin swelling worsening symptoms or changes in her health.

## 2017-02-22 NOTE — ED Notes (Signed)
Dicussed patient with Dr. Fanny Bien. Verbal order given for US venous on right leg.

## 2017-02-22 NOTE — ED Provider Notes (Signed)
[facility]-EMERGENCY DEPARTMENT Provider Note   CSN: [id number] Arrival date & time: 02/22/17  1329     History   Chief Complaint Chief Complaint  Patient presents with  . Groin Pain    HPI Francisco Gardner is a 46 y.o. male presents to the emergency department for evaluation of right groin pain. Patient has had pain and swelling for the last 24 hours. He denies any fevers but has had some mild chills. He has not taken any medications for pain or fevers. He denies any body aches, rashes. Pain is 3 out of 10 and he describes it as pressure on the right groin. He denies any abdominal pain, urinary symptoms.No bowel problems. Has a history of boils. He is diabetic, last hemoglobin A1c 8.6.  HPI  Past Medical History:  Diagnosis Date  . Arthritis   . Atrial fibrillation and flutter (HCC)   . Diabetes mellitus without complication (HCC)   . Diabetic neuropathy (HCC)   . Gout   . Hypertension   . Unstable angina (HCC)     There are no active problems to display for this patient.   Past Surgical History:  Procedure Laterality Date  . APPENDECTOMY    . SHOULDER SURGERY Right   . TONSILLECTOMY         Home Medications    Prior to Admission medications   Medication Sig Start Date End Date Taking? Authorizing Provider  isosorbide mononitrate (IMDUR) 30 MG 24 hr tablet Take 1 tablet (30 mg total) by mouth daily. 02/09/17 02/09/18  Jene Every, MD  sulfamethoxazole-trimethoprim (BACTRIM DS,SEPTRA DS) 800-160 MG tablet Take 1 tablet by mouth 2 (two) times daily. X 10 days 02/22/17   Evon Slack, PA-C    Family History No family history on file.  Social History Social History  Substance Use Topics  . Smoking status: Never Smoker  . Smokeless tobacco: Never Used  . Alcohol use No     Allergies   Penicillins   Review of Systems Review of Systems  Constitutional: Positive for chills. Negative for fever.  Respiratory: Negative for shortness of breath.     Cardiovascular: Negative for chest pain.  Gastrointestinal: Negative for abdominal pain.  Genitourinary: Negative for difficulty urinating, dysuria and urgency.  Musculoskeletal: Negative for back pain and myalgias.  Skin: Positive for wound. Negative for color change and rash.  Neurological: Negative for dizziness and headaches.     Physical Exam Updated Vital Signs BP 113/68 (BP Location: Left Arm)   Pulse (!) 115   Temp 98.2 F (36.8 C) (Oral)   Resp 16   Ht  (2.057 m)   Wt (!) 196.4 kg (433 lb)   SpO2 97%   BMI 46.40 kg/m   Physical Exam  Constitutional: He is oriented to person, place, and time. He appears well-developed and well-nourished.  HENT:  Head: Normocephalic and atraumatic.  Eyes: Conjunctivae are normal.  Neck: Normal range of motion.  Cardiovascular: Normal rate.   Pulmonary/Chest: Effort normal. No respiratory distress.  Abdominal: Soft. There is no tenderness.  Musculoskeletal: Normal range of motion.  Neurological: He is alert and oriented to person, place, and time.  Skin: Skin is warm. No rash noted.  Examination of the right groin along the base of the scrotum shows a 4 x 4 cm area of swelling, redness, fluctuance. No significant surrounding cellulitis. No streaking.There is mild induration. There is a centralized opening with mild drainage is purulent. Patient is nontender throughout the scrotum or testicles.  He has no abdominal distention, tenderness.  Psychiatric: He has a normal mood and affect. His behavior is normal. Thought content normal.     ED Treatments / Results  Labs (all labs ordered are listed, but only abnormal results are displayed) Labs Reviewed - No data to display  EKG  EKG Interpretation None       Radiology US Pelvis Limited (transabdominal Only)  Result Date: 02/22/2017 CLINICAL DATA:  Painful red swollen area within the crease of the right groin for 1 day. EXAM: LIMITED ULTRASOUND OF PELVIS TECHNIQUE: Limited  transabdominal ultrasound examination of the pelvis was performed. COMPARISON:  None. FINDINGS: Anatomic area evaluated:  Right groin Soft tissue mass:  No Cyst or complex cyst: There is a complex cystic area with indistinct margins identified within the area of concern. This measures approximately 2.5 x 1.4 x 1.0 cm. There is surrounding soft tissue edema noted. Internal blood flow:  None IMPRESSION: 1. Complex cystic area with indistinct margins within the area of concern in the right groin. In the acute setting findings may represent a small subcutaneous abscess with surrounding cellulitis. Electronically Signed   By: Signa Kell M.D.   On: 02/22/2017 14:41    Procedures Procedures (including critical care time) INCISION AND DRAINAGE Performed by: Patience Musca Consent: Verbal consent obtained. Risks and benefits: risks, benefits and alternatives were discussed Type: abscess  Body area: right groin  Anesthesia: local infiltration  Incision was made with a scalpel.  Local anesthetic: lidocaine 1%% without epinephrine  Anesthetic total: 3 ml  Complexity: complex Blunt dissection to break up loculations  Drainage: purulent  Drainage amount: 10-15 mL  Packing material: 1/4 in iodoform gauze  Patient tolerance: Patient tolerated the procedure well with no immediate complications.     Medications Ordered in ED Medications  lidocaine (PF) (XYLOCAINE) 1 % injection 2 mL (2 mLs Infiltration Given 02/22/17 1559)  acetaminophen (TYLENOL) tablet 1,000 mg (1,000 mg Oral Given 02/22/17 1624)     Initial Impression / Assessment and Plan / ED Course  I have reviewed the triage vital signs and the nursing notes.  Pertinent labs & imaging results that were available during my care of the patient were reviewed by me and considered in my medical decision making (see chart for details).     47 year old male with right groin Pergola abscess. Incision and drainage was  performed. Patient started on Bactrim DS.tylenol as needed for pain and fevers. She'll follow up in 2-3 days for wound check and packingremoval. He is educated on signs and symptoms to return to the ED for  Final Clinical Impressions(s) / ED Diagnoses   Final diagnoses:  Abscess of groin, right    New Prescriptions New Prescriptions   SULFAMETHOXAZOLE-TRIMETHOPRIM (BACTRIM DS,SEPTRA DS) 800-160 MG TABLET    Take 1 tablet by mouth 2 (two) times daily. X 10 days     Ronnette Juniper 02/22/17 1642    Emily Filbert, MD 02/22/17 [phone]

## 2017-03-22 DIAGNOSIS — E1140 Type 2 diabetes mellitus with diabetic neuropathy, unspecified: Secondary | ICD-10-CM | POA: Insufficient documentation

## 2017-03-22 DIAGNOSIS — I4891 Unspecified atrial fibrillation: Secondary | ICD-10-CM | POA: Insufficient documentation

## 2017-03-22 DIAGNOSIS — I10 Essential (primary) hypertension: Secondary | ICD-10-CM | POA: Insufficient documentation

## 2017-03-22 DIAGNOSIS — I82432 Acute embolism and thrombosis of left popliteal vein: Secondary | ICD-10-CM | POA: Insufficient documentation

## 2017-03-23 ENCOUNTER — Emergency Department
Admission: EM | Admit: 2017-03-23 | Discharge: 2017-03-23 | Disposition: A | Discharge status: Home / Self Care | Payer: Self-pay | Attending: Emergency Medicine | Admitting: Emergency Medicine

## 2017-03-23 ENCOUNTER — Encounter: Payer: Self-pay | Admitting: Emergency Medicine

## 2017-03-23 ENCOUNTER — Emergency Department: Payer: Self-pay

## 2017-03-23 DIAGNOSIS — I82532 Chronic embolism and thrombosis of left popliteal vein: Secondary | ICD-10-CM

## 2017-03-23 DIAGNOSIS — I8002 Phlebitis and thrombophlebitis of superficial vessels of left lower extremity: Secondary | ICD-10-CM

## 2017-03-23 MED ORDER — RIVAROXABAN 15 MG PO TABS
15.0000 mg | ORAL_TABLET | Freq: Once | ORAL | Status: AC
  Administered 2017-03-23: 15 mg via ORAL
  Filled 2017-03-23: qty 1

## 2017-03-23 MED ORDER — RIVAROXABAN (XARELTO) VTE STARTER PACK (15 & 20 MG): ORAL_TABLET | ORAL | 0 refills | Status: DC

## 2017-03-23 NOTE — ED Triage Notes (Signed)
Patient with complaint of pain and swelling to left leg times one week. Patient states that he has a history of a dvt and the pain is similar.

## 2017-03-23 NOTE — ED Notes (Signed)
Patient is resting comfortably. 

## 2017-03-23 NOTE — Discharge Instructions (Signed)
Please follow up with hematology for further management of your DVT

## 2017-03-23 NOTE — ED Notes (Signed)
ED Provider at bedside. 

## 2017-03-23 NOTE — ED Notes (Signed)
Pt to the er for pain in the left leg behind the knee that began in his foot. Pt has a history of clots and the pain feels the same.

## 2017-03-23 NOTE — ED Provider Notes (Signed)
[facility] Emergency Department Provider Note   ____________________________________________   First MD Initiated Contact with Patient 03/23/17 0236     (approximate)  I have reviewed the triage vital signs and the nursing notes.   HISTORY  Chief Complaint Leg Pain and Leg Swelling    HPI Francisco Gardner is a 46 y.o. male Who comes into the hospital today with some left leg pain. The patient states that this pain started about a week ago.He reports that he has not taken any medication for his pain. the patient does have a history of a DVT in his right leg so he was concerned about a clot in the left leg. The patient has also noticed some mild swelling in his left leg as well as some redness streaking up his leg.The patient rates his pain a 4 out of 10 in intensity currently. 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 He denies any shortness of breath or chest pain.   Past Medical History:  Diagnosis Date  . Arthritis   . Atrial fibrillation and flutter (HCC)   . Diabetes mellitus without complication (HCC)   . Diabetic neuropathy (HCC)   . Gout   . Hypertension   . Unstable angina (HCC)     There are no active problems to display for this patient.   Past Surgical History:  Procedure Laterality Date  . APPENDECTOMY    . SHOULDER SURGERY Right   . TONSILLECTOMY      Prior to Admission medications   Medication Sig Start Date End Date Taking? Authorizing Provider  isosorbide mononitrate (IMDUR) 30 MG 24 hr tablet Take 1 tablet (30 mg total) by mouth daily. 02/09/17 02/09/18  Jene Every, MD  Rivaroxaban 15 & 20 MG TBPK Take as directed on package: Start with one  tablet by mouth twice a day with food. On Day 22, switch to one  tablet once a day with food. 03/23/17   Rebecka Apley, MD  sulfamethoxazole-trimethoprim (BACTRIM DS,SEPTRA DS) 800-160 MG tablet Take 1 tablet by mouth 2 (two)  times daily. X 10 days 02/22/17   Evon Slack, PA-C    Allergies Penicillins  No family history on file.  Social History Social History  Substance Use Topics  . Smoking status: Never Smoker  . Smokeless tobacco: Never Used  . Alcohol use No    Review of Systems  Constitutional: No fever/chills Eyes: No visual changes. ENT: No sore throat. Cardiovascular: Denies chest pain. Respiratory: Denies shortness of breath. Gastrointestinal: No abdominal pain.  No nausea, no vomiting.  No diarrhea.  No constipation. Genitourinary: Negative for dysuria. Musculoskeletal: left leg pain and swelling Skin: Negative for rash. Neurological: Negative for headaches, focal weakness or numbness.   ____________________________________________   PHYSICAL EXAM:  VITAL SIGNS: ED Triage Vitals [03/23/17 0001]  Enc Vitals Group     BP (!) 140/111     Pulse Rate (!) 104     Resp 18     Temp 98.9 F (37.2 C)     Temp Source Oral     SpO2 96 %     Weight (!) 432 lb (196 kg)     Height  (2.057 m)     Head Circumference      Peak Flow      Pain Score 4     Pain Loc      Pain Edu?  Excl. in GC?     Constitutional: Alert and oriented. Well appearing and in mild distress. Eyes: Conjunctivae are normal. PERRL. EOMI. Head: Atraumatic. Nose: No congestion/rhinnorhea. Mouth/Throat: Mucous membranes are moist.  Oropharynx non-erythematous. Cardiovascular: Normal rate, regular rhythm. Grossly normal heart sounds.  Good peripheral circulation. Respiratory: Normal respiratory effort.  No retractions. Lungs CTAB. Gastrointestinal: Soft and nontender. No distention. Positive bowel sounds Musculoskeletal: mild left calf pain to palpation with some minimal swelling   Neurologic:  Normal speech and language.  Skin:  Skin is warm, dry and intact.  Psychiatric: Mood and affect are normal.   ____________________________________________   LABS (all labs ordered are listed, but only  abnormal results are displayed)  Labs Reviewed - No data to display ____________________________________________  EKG  none ____________________________________________  RADIOLOGY  US Venous Img Lower Unilateral Left  Result Date: 03/23/2017 CLINICAL DATA:  46 y/o  M; pain and swelling. EXAM: LEFT LOWER EXTREMITY VENOUS DOPPLER ULTRASOUND TECHNIQUE: Gray-scale sonography with graded compression, as well as color Doppler and duplex ultrasound were performed to evaluate the lower extremity deep venous systems from the level of the common femoral vein and including the common femoral, femoral, profunda femoral, popliteal and calf veins including the posterior tibial, peroneal and gastrocnemius veins when visible. The superficial great saphenous vein was also interrogated. Spectral Doppler was utilized to evaluate flow at rest and with distal augmentation maneuvers in the common femoral, femoral and popliteal veins. COMPARISON:  07/28/2014 lower extremity venous ultrasound. FINDINGS: Contralateral Common Femoral Vein: Respiratory phasicity is normal and symmetric with the symptomatic side. No evidence of thrombus. Normal compressibility. Common Femoral Vein: No evidence of thrombus. Normal compressibility, respiratory phasicity and response to augmentation. Saphenofemoral Junction: No evidence of thrombus. Normal compressibility and flow on color Doppler imaging. Profunda Femoral Vein: No evidence of thrombus. Normal compressibility and flow on color Doppler imaging. Femoral Vein: No evidence of thrombus. Normal compressibility, respiratory phasicity and response to augmentation. Popliteal Vein: Nonocclusive and noncompressible filling defect of the left popliteal vein compatible with thrombus. Calf Veins: Poorly visualized. Superficial Great Saphenous Vein: Occlusive thrombosis of the great saphenous vein in the mid calf. Otherwise patent. Venous Reflux:  None. Other Findings:  None. IMPRESSION: 1.  Nonocclusive thrombus of the left popliteal vein. 2. Occlusive thrombus of left great saphenous vein in the calf. These results will be called to the ordering clinician or representative by the Radiologist Assistant, and communication documented in the PACS or zVision Dashboard. Electronically Signed   By: Mitzi Hansen M.D.   On: 03/23/2017 01:00    ____________________________________________   PROCEDURES  Procedure(s) performed: None  Procedures  Critical Care performed: No  ____________________________________________   INITIAL IMPRESSION / ASSESSMENT AND PLAN / ED COURSE  As part of my medical decision making, I reviewed the following data within the electronic MEDICAL RECORD NUMBER Notes from prior ED visits and [place] Controlled Substance Database   This is a 46 year old male who comes into the hospital today with some left leg pain.  My differential diagnosis includes DVT given the patient's history as well as musculoskeletal pain.  The patient had ultrasound which showed a nonocclusive noncompressible DVT in his popliteal vein as well as an occlusive DVT and his superficial saphenous vein. The patient's calf vessels were not well visualized. Given this nonocclusive DVT as well as a superficial thrombus I will give the patient a dose of Xarelto. I will treat the patient with this medication as he does have some clot burden. He will  be discharged to home to follow-up with his primary care physician as well as hematology. The patient has no other concerns.      ____________________________________________   FINAL CLINICAL IMPRESSION(S) / ED DIAGNOSES  Final diagnoses:  Chronic deep vein thrombosis (DVT) of popliteal vein of left lower extremity (HCC)  Thrombophlebitis of superficial veins of left lower extremity      NEW MEDICATIONS STARTED DURING THIS VISIT:  New Prescriptions   RIVAROXABAN 15 & 20 MG TBPK    Take as directed on package: Start with one   tablet by mouth twice a day with food. On Day 22, switch to one  tablet once a day with food.     Note:  This document was prepared using Dragon voice recognition software and may include unintentional dictation errors.    Rebecka Apley, MD 03/23/17 [phone]

## 2017-07-16 ENCOUNTER — Telehealth: Payer: Self-pay | Admitting: Pharmacist

## 2017-07-16 NOTE — Telephone Encounter (Signed)
07/16/17 Received a pharmacy printout for Janumet 50/1000 mg Take 1 tablet by mouth two times daily #180. Geologist, engineeringrinted [employer] application, mailing provider Shane CrutchLinda Markley, PA [facility] her portion to sign & return, also mailing patient his portion to sign & return.  07/16/17 Received a pharmacy printout for Xarelto 20mg  Take 1 tablet by mouth daily-Patient will receive a pharmacy card for this medication and pick up at the pharmacy of their choice. Printed [employer] application mailing provider Shane CrutchLinda Markley, [place] [facility] portion to sign & return, also mailing patient his portion to sign & return  07/16/17 Patient has Levemir on his med list-we have not order in quite a while. Office managerrinted Novo Nordisk application for [employer] LotsLevemir Flexpen Inject 20 unit daily in the evening # 3 & Novofine 32G tips use daily with flexpen. Mailing patient his portion to sign & return, also mailing provider Shane CrutchLinda Markley, PA [facility] her portion to Verify dose, directions & sign to return to us.AJ  07/16/17  Kathie RhodesBetty has talked with patient and sister by phone on income/support, mailing [phone] for sister-Marlyn Wooten to sign & return.Forde RadonAJ

## 2017-07-28 ENCOUNTER — Telehealth: Payer: Self-pay | Admitting: Pharmacy Technician

## 2017-07-28 NOTE — Telephone Encounter (Signed)
Received updated proof of income.  Patient eligible to receive medication assistance at [facility] through 2019, as long as eligibility requirements continue to be met.  [place] [facility] [place] [facility]

## 2017-10-03 ENCOUNTER — Ambulatory Visit: Payer: Self-pay | Admitting: Pharmacist

## 2017-10-03 ENCOUNTER — Encounter: Payer: Self-pay | Admitting: Pharmacist

## 2017-10-03 VITALS — BP 108/72 | HR 120 | Ht >= 80 in | Wt >= 6400 oz

## 2017-10-03 DIAGNOSIS — Z79899 Other long term (current) drug therapy: Secondary | ICD-10-CM

## 2017-10-03 NOTE — Progress Notes (Addendum)
[facility] Visit Note  Patient: Francisco Gardner MRN: [id number] Date of Birth: 03-04-1971 [facility]   Francisco Gardner 47 y.o. male presents for an initial medication therapy managment visit today.  BP 108/72 (BP Location: Right Arm, Patient Position: Sitting, Cuff Size: Large)   Pulse (!) 120   Ht 6\' 9"  (2.057 m)   Wt (!) 418 lb (189.6 kg)   BMI 44.79 kg/m   Patient Information   Past Medical History:  Diagnosis Date  . Arthritis   . Atrial fibrillation and flutter (HCC)   . Diabetes mellitus without complication (HCC)   . Diabetic neuropathy (HCC)   . Gout   . Hypertension   . Unstable angina [facility](HCC)       Past Surgical History:  Procedure Laterality Date  . APPENDECTOMY    . SHOULDER SURGERY Right   . TONSILLECTOMY      History reviewed. No pertinent family history.  New Diagnoses (since last visit): NA  Family Support: Good        Social History   Substance and Sexual Activity  Alcohol Use No    Social History   Tobacco Use  Smoking Status Never Smoker  Smokeless Tobacco Never Used    Health Maintenance  Topic Date Due  . HIV Screening  02/22/1984  . TETANUS/TDAP  02/22/1988  . INFLUENZA VACCINE  01/08/2018   Outpatient Encounter Medications as of 10/03/2017  Medication Sig  . amLODipine (NORVASC) 10 MG tablet Take 10 mg by mouth daily.  Marland Kitchen. atorvastatin (LIPITOR) 40 MG tablet Take 40 mg by mouth daily.  . carvedilol (COREG) 12.5 MG tablet Take 12.5 mg by mouth 2 (two) times daily with a meal.  . hydrochlorothiazide (MICROZIDE) 12.5 MG capsule Take 12.5 mg by mouth daily.  . insulin detemir (LEVEMIR) 100 UNIT/ML injection Inject 70 Units into the skin at bedtime.  . isosorbide mononitrate (IMDUR) 30 MG 24 hr tablet Take 1 tablet (30 mg total) by mouth daily.  Marland Kitchen. lisinopril (PRINIVIL,ZESTRIL) 20 MG tablet Take 20 mg by mouth daily.  Marland Kitchen. omeprazole (PRILOSEC) 20 MG capsule Take 20 mg by mouth daily.  .  rivaroxaban (XARELTO) 20 MG TABS tablet Take 20 mg by mouth daily with supper.  . sitaGLIPtin-metformin (JANUMET) 50-1000 MG tablet Take 1 tablet by mouth 2 (two) times daily with a meal.  . [DISCONTINUED] Rivaroxaban 15 & 20 MG TBPK Take as directed on package: Start with one 15mg  tablet by mouth twice a day with food. On Day 22, switch to one 20mg  tablet once a day with food.  . sulfamethoxazole-trimethoprim (BACTRIM DS,SEPTRA DS) 800-160 MG tablet Take 1 tablet by mouth 2 (two) times daily. X 10 days (Patient not taking: Reported on 10/03/2017)   No facility-administered encounter medications on file as of 10/03/2017.     Assessment and Plan:  Medication Compliance: Patient is able to correctly state the names of his medications, their indications, and their dosing with some prompting. States that he does not currently use a pill box, but would rather not use one at this time. States that he may miss about  ~1 dose per week of his evening medications.   HTN: Patient currently taking amlodipine, carvedilol, HCTZ and lisinopril. Patient states that he feels his BP is running on the lower end lately and was low in the office today. States that he feels dizzy sometimes due to lower BP. Suggested that he monitor his BP daily, stay hydrated, and mention this  to his MD at his next visit. Suggested that he not take his BP meds if he feeling symptomatic or checks his BP and it is <~100-110/80 mmHg.  DM: Patient is currently taking Janumet and Levemir, well tolerated. Patient states that his most recent A1c was 10.5% and his FBG have been in the low to mid 200's lately. He has been instructed to titrate his insulin by 2 units every 3 days to a FBG of <150 mg/dL, and is currently at 70 units. States that he does not follow any specific dietary recommendations but has been limiting his sweets and sodas and has lost ~20 lbs. Plans to continue titrating insulin as directed and continue to limit sweets and sodas.    GERD: Patient currently taking omeprazole, well tolerated and well controlled.   CSA: Patient currently taking isosorbide, well tolerated and well controlled.   DVT: Patient currently taking Xarelto, well tolerated with no signs or symptoms of bleeding.    RTC: 1 year  Cori Razor, PharmD Candidate   Cosigned:Christan Leonor Liv, PharmD, RPh [facility]) [phone]

## 2017-10-06 ENCOUNTER — Other Ambulatory Visit: Payer: Self-pay

## 2018-05-21 IMAGING — US US EXTREM LOW VENOUS*L*
1 series · 13 of 24 positions shown · non-contrast
Comparison: 07/28/2014 lower extremity venous ultrasound.

CLINICAL DATA: 46 y/o  M; pain and swelling.



[Series 1: us extrem low venous*left* · 0.13mm/px · 13 of 35 slices shown]
[im 1/35]
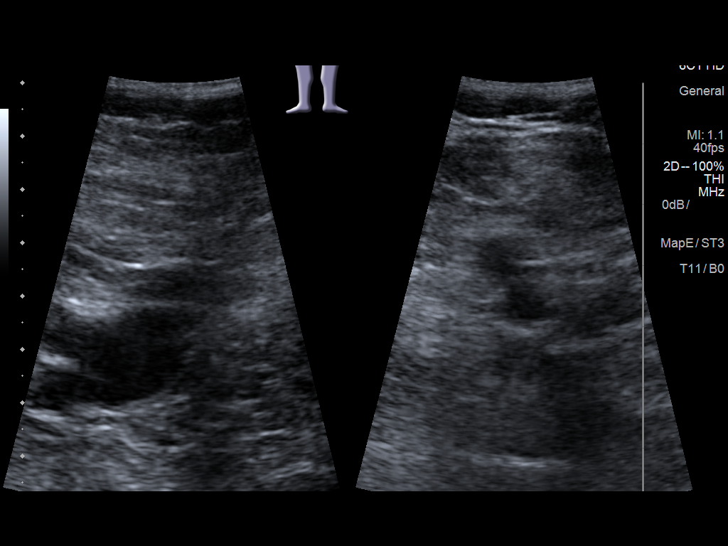
[im 3/35]
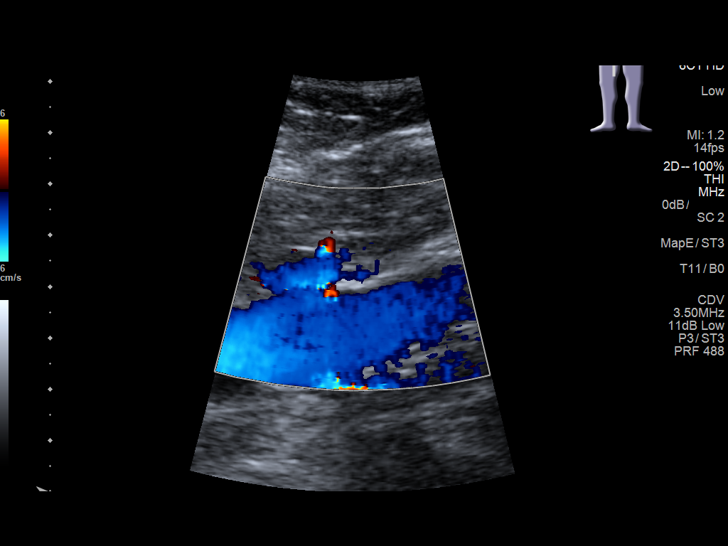
[im 6/35]
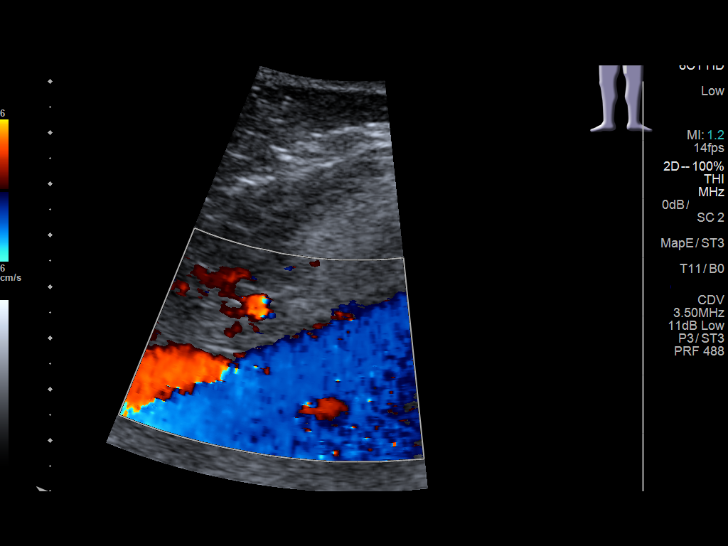
[im 9/35]
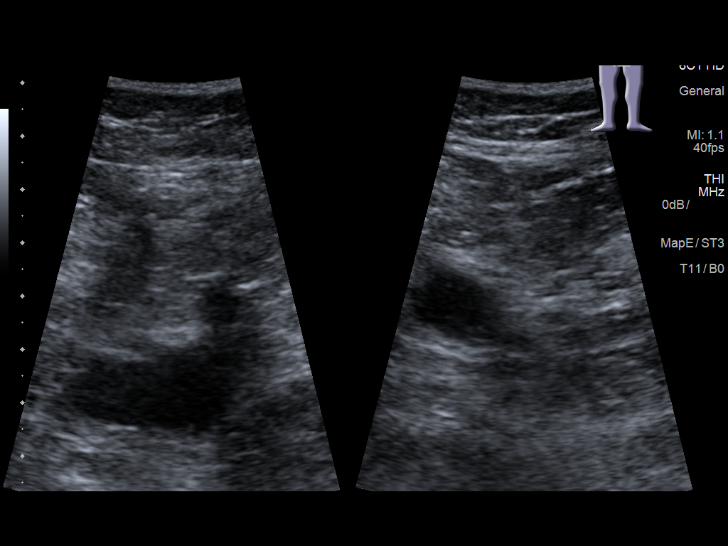
[im 12/35]
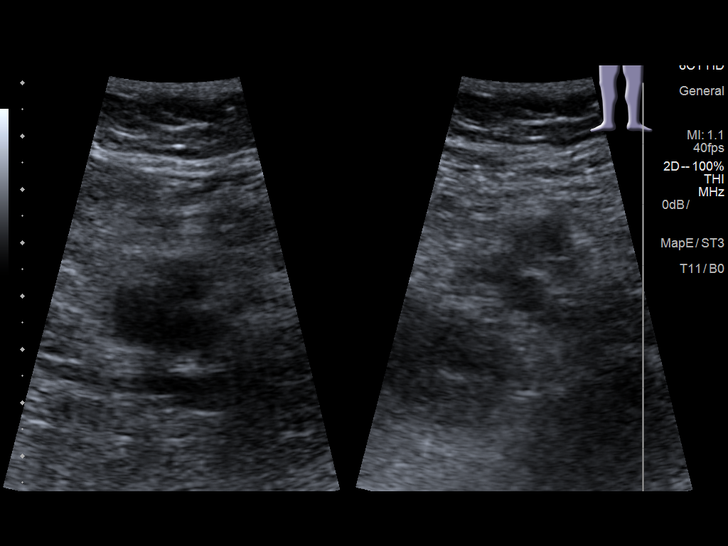
[im 15/35]
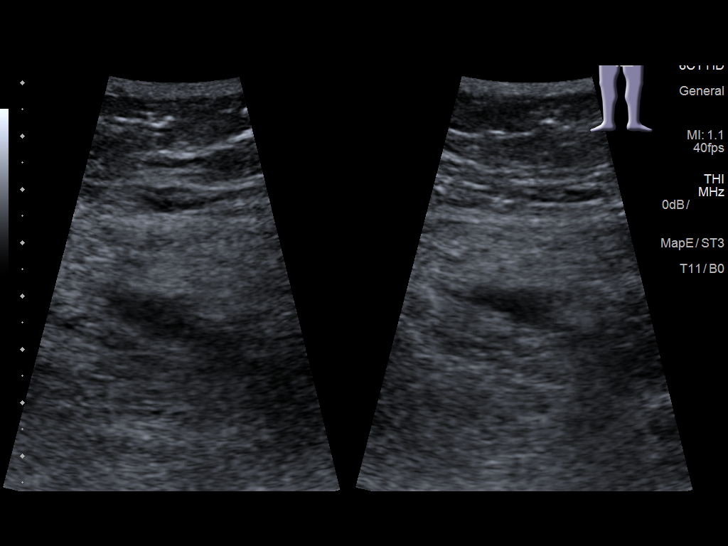
[im 18/35]
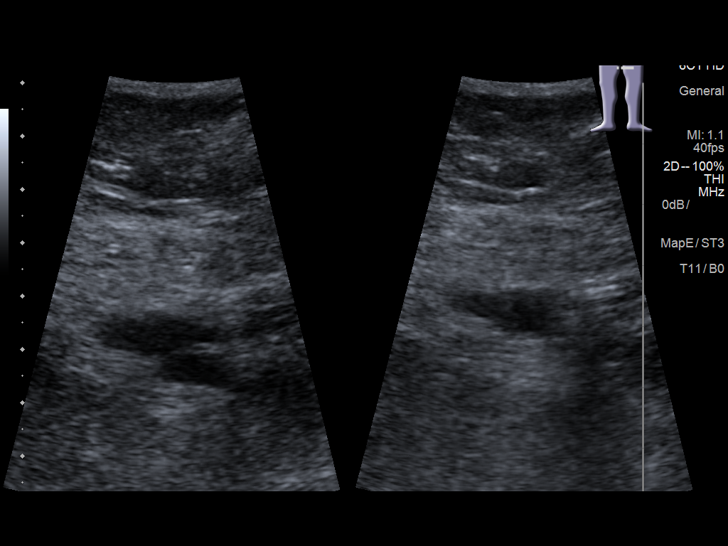
[im 20/35]
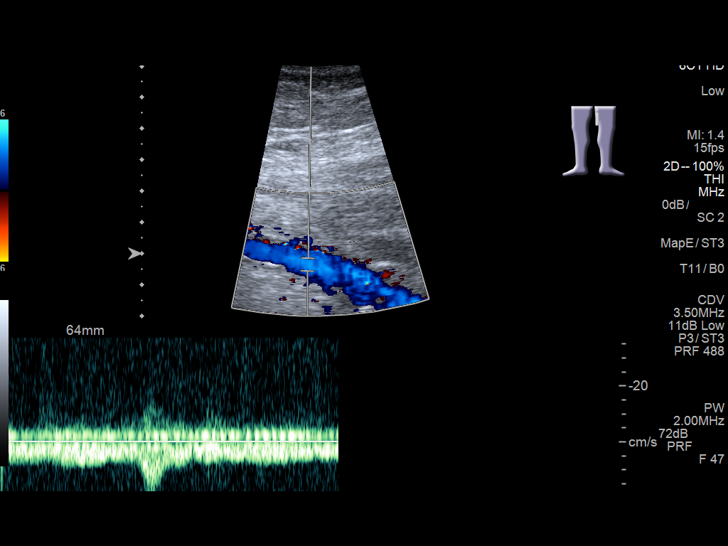
[im 23/35]
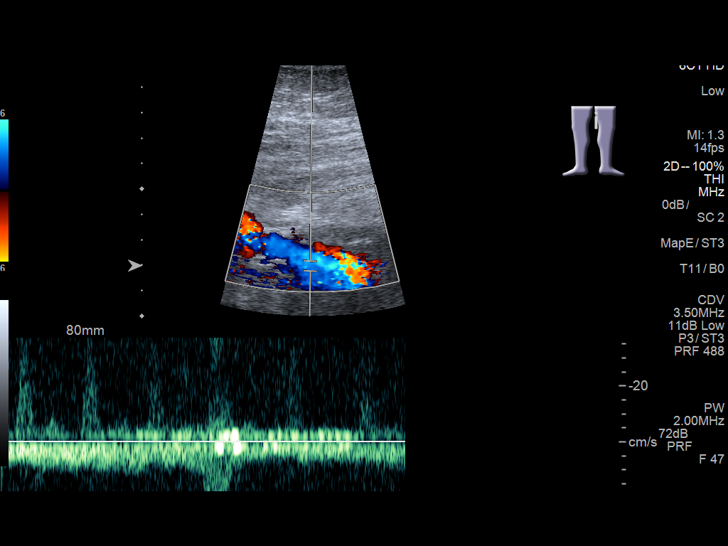
[im 26/35]
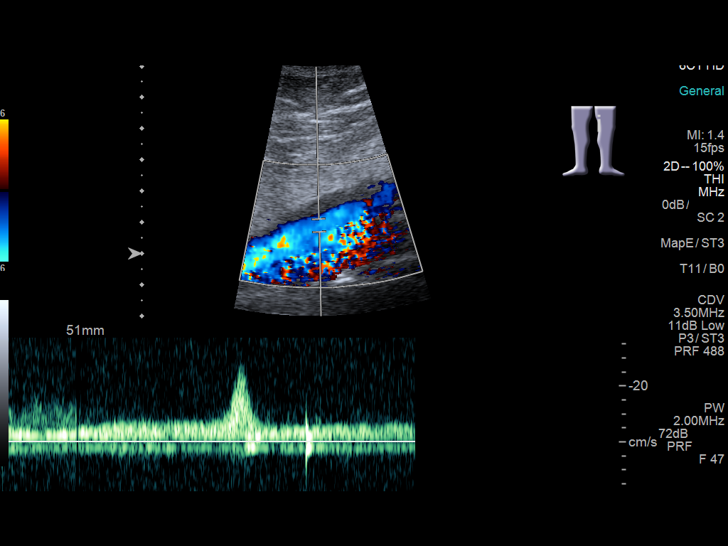
[im 29/35]
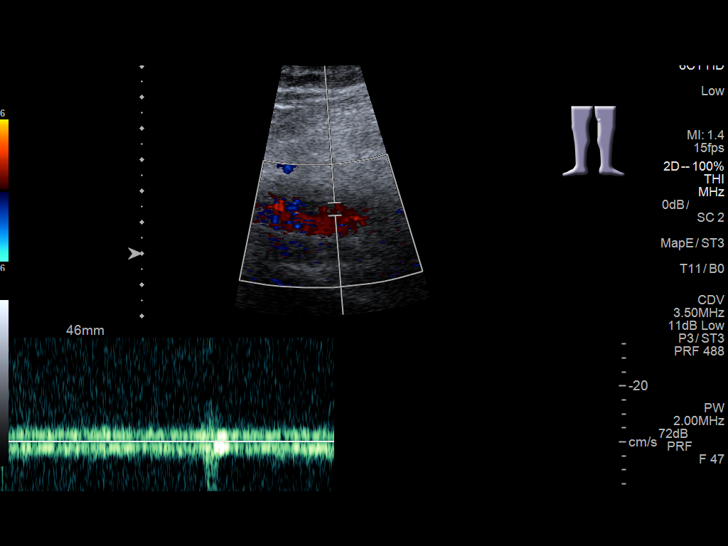
[im 32/35]
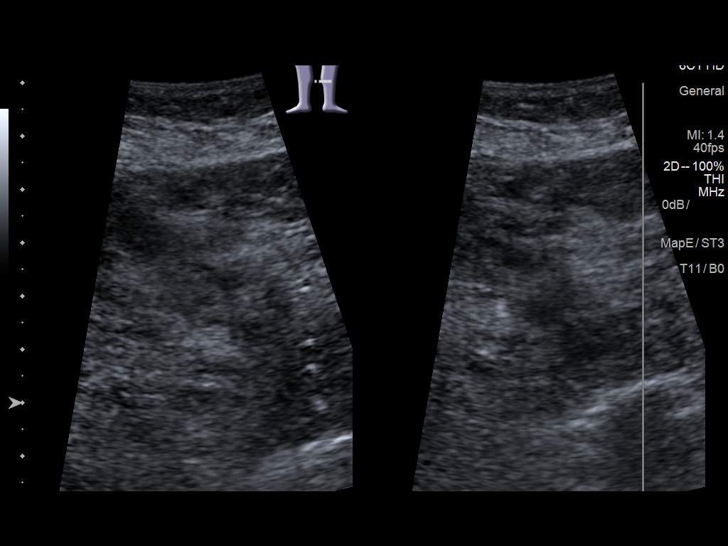
[im 35/35]
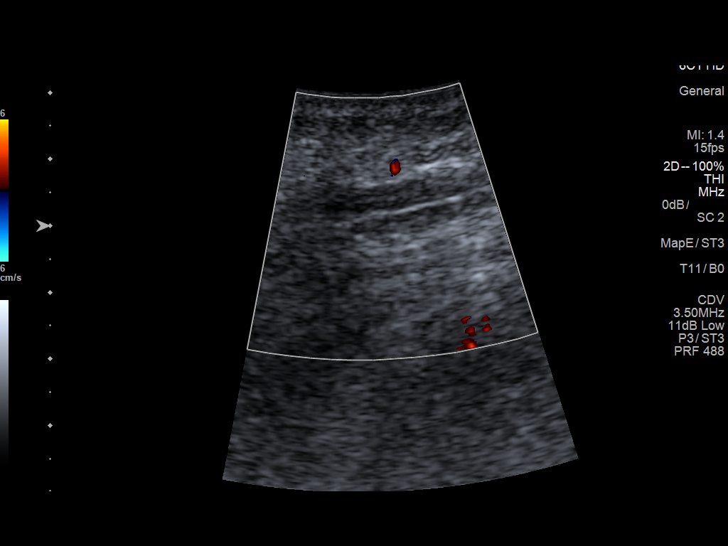

[13 of 24 positions shown; findings below may reference images not displayed]

FINDINGS: Contralateral Common Femoral Vein: Respiratory phasicity is normal
and symmetric with the symptomatic side. No evidence of thrombus.
Normal compressibility.

Common Femoral Vein: No evidence of thrombus. Normal
compressibility, respiratory phasicity and response to augmentation.

Saphenofemoral Junction: No evidence of thrombus. Normal
compressibility and flow on color Doppler imaging.

Profunda Femoral Vein: No evidence of thrombus. Normal
compressibility and flow on color Doppler imaging.

Femoral Vein: No evidence of thrombus. Normal compressibility,
respiratory phasicity and response to augmentation.

Popliteal Vein: Nonocclusive and noncompressible filling defect of
the left popliteal vein compatible with thrombus.

Calf Veins: Poorly visualized.

Superficial Great Saphenous Vein: Occlusive thrombosis of the great
saphenous vein in the mid calf. Otherwise patent.

Venous Reflux:  None.

Other Findings:  None.
IMPRESSION: 1. Nonocclusive thrombus of the left popliteal vein.
2. Occlusive thrombus of left great saphenous vein in the calf.
These results will be called to the ordering clinician or
representative by the Radiologist Assistant, and communication
documented in the PACS or zVision Dashboard.

By: Laaouina Tiger M.D.

## 2018-10-07 ENCOUNTER — Telehealth: Payer: Self-pay | Admitting: Pharmacy Technician

## 2018-10-07 NOTE — Telephone Encounter (Signed)
Unable to mail denial letter to patient because he did not leave a forwarding address. The letter is scanned into his Epic Chart.  Claybon Jabs, CPhT

## 2018-10-07 NOTE — Telephone Encounter (Signed)
Patient no longer eligible to receive services provided by [facility]. Denial letter mailed to patient informing him. He stated to [place] that he was no longer an [facility] resident. Patient can resume services if he meets our eligibility criteria after the application process.  Claybon Jabs, CPhT [facility]
